# Patient Record
Sex: Male | Born: 1995 | Race: White | Hispanic: No | Marital: Single | Smoking: Never smoker
Health system: Southern US, Community
[De-identification: ages and names within clinical notes are randomized; demographics above are authoritative.]

## PROBLEM LIST (undated history)

## (undated) ENCOUNTER — Ambulatory Visit: Admission: EM | Discharge status: Absent without leave | Payer: BC Managed Care – PPO

---
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
## 2015-12-21 DIAGNOSIS — Z23 Encounter for immunization: Secondary | ICD-10-CM | POA: Diagnosis not present

## 2016-03-29 DIAGNOSIS — J069 Acute upper respiratory infection, unspecified: Secondary | ICD-10-CM | POA: Diagnosis not present

## 2016-03-29 DIAGNOSIS — J029 Acute pharyngitis, unspecified: Secondary | ICD-10-CM | POA: Diagnosis not present

## 2016-10-15 DIAGNOSIS — L239 Allergic contact dermatitis, unspecified cause: Secondary | ICD-10-CM | POA: Diagnosis not present

## 2016-10-29 DIAGNOSIS — S40212A Abrasion of left shoulder, initial encounter: Secondary | ICD-10-CM | POA: Diagnosis not present

## 2016-10-29 DIAGNOSIS — R58 Hemorrhage, not elsewhere classified: Secondary | ICD-10-CM | POA: Diagnosis not present

## 2016-10-29 DIAGNOSIS — M25522 Pain in left elbow: Secondary | ICD-10-CM | POA: Diagnosis not present

## 2016-10-29 DIAGNOSIS — T1490XA Injury, unspecified, initial encounter: Secondary | ICD-10-CM | POA: Diagnosis not present

## 2016-10-29 DIAGNOSIS — S41112A Laceration without foreign body of left upper arm, initial encounter: Secondary | ICD-10-CM | POA: Diagnosis not present

## 2016-10-29 DIAGNOSIS — M79602 Pain in left arm: Secondary | ICD-10-CM | POA: Diagnosis not present

## 2016-10-31 DIAGNOSIS — R21 Rash and other nonspecific skin eruption: Secondary | ICD-10-CM | POA: Diagnosis not present

## 2016-10-31 DIAGNOSIS — S5002XA Contusion of left elbow, initial encounter: Secondary | ICD-10-CM | POA: Diagnosis not present

## 2016-10-31 DIAGNOSIS — M542 Cervicalgia: Secondary | ICD-10-CM | POA: Diagnosis not present

## 2016-10-31 DIAGNOSIS — Z6823 Body mass index (BMI) 23.0-23.9, adult: Secondary | ICD-10-CM | POA: Diagnosis not present

## 2016-11-06 DIAGNOSIS — S51012A Laceration without foreign body of left elbow, initial encounter: Secondary | ICD-10-CM | POA: Diagnosis not present

## 2016-11-06 DIAGNOSIS — Z4802 Encounter for removal of sutures: Secondary | ICD-10-CM | POA: Diagnosis not present

## 2016-11-06 DIAGNOSIS — L42 Pityriasis rosea: Secondary | ICD-10-CM | POA: Diagnosis not present

## 2016-12-12 DIAGNOSIS — S5002XA Contusion of left elbow, initial encounter: Secondary | ICD-10-CM | POA: Diagnosis not present

## 2016-12-16 DIAGNOSIS — Z23 Encounter for immunization: Secondary | ICD-10-CM | POA: Diagnosis not present

## 2016-12-19 DIAGNOSIS — M25522 Pain in left elbow: Secondary | ICD-10-CM | POA: Diagnosis not present

## 2017-01-05 DIAGNOSIS — F17200 Nicotine dependence, unspecified, uncomplicated: Secondary | ICD-10-CM | POA: Diagnosis not present

## 2017-01-05 DIAGNOSIS — Z888 Allergy status to other drugs, medicaments and biological substances status: Secondary | ICD-10-CM | POA: Diagnosis not present

## 2017-01-05 DIAGNOSIS — S50352A Superficial foreign body of left elbow, initial encounter: Secondary | ICD-10-CM | POA: Diagnosis not present

## 2017-01-05 DIAGNOSIS — Z1881 Retained glass fragments: Secondary | ICD-10-CM | POA: Diagnosis not present

## 2017-01-05 DIAGNOSIS — M795 Residual foreign body in soft tissue: Secondary | ICD-10-CM | POA: Diagnosis not present

## 2017-01-14 DIAGNOSIS — M25562 Pain in left knee: Secondary | ICD-10-CM | POA: Diagnosis not present

## 2017-01-14 DIAGNOSIS — Z9889 Other specified postprocedural states: Secondary | ICD-10-CM | POA: Diagnosis not present

## 2017-01-14 DIAGNOSIS — M2242 Chondromalacia patellae, left knee: Secondary | ICD-10-CM | POA: Diagnosis not present

## 2017-01-14 DIAGNOSIS — M2241 Chondromalacia patellae, right knee: Secondary | ICD-10-CM | POA: Diagnosis not present

## 2017-01-14 DIAGNOSIS — M25561 Pain in right knee: Secondary | ICD-10-CM | POA: Diagnosis not present

## 2017-01-14 DIAGNOSIS — G8929 Other chronic pain: Secondary | ICD-10-CM | POA: Diagnosis not present

## 2017-01-27 DIAGNOSIS — M2242 Chondromalacia patellae, left knee: Secondary | ICD-10-CM | POA: Diagnosis not present

## 2017-01-27 DIAGNOSIS — M2241 Chondromalacia patellae, right knee: Secondary | ICD-10-CM | POA: Diagnosis not present

## 2017-02-03 DIAGNOSIS — M2241 Chondromalacia patellae, right knee: Secondary | ICD-10-CM | POA: Diagnosis not present

## 2017-02-03 DIAGNOSIS — M2242 Chondromalacia patellae, left knee: Secondary | ICD-10-CM | POA: Diagnosis not present

## 2017-02-05 DIAGNOSIS — M2242 Chondromalacia patellae, left knee: Secondary | ICD-10-CM | POA: Diagnosis not present

## 2017-02-05 DIAGNOSIS — M2241 Chondromalacia patellae, right knee: Secondary | ICD-10-CM | POA: Diagnosis not present

## 2017-02-09 DIAGNOSIS — M25561 Pain in right knee: Secondary | ICD-10-CM | POA: Diagnosis not present

## 2017-02-09 DIAGNOSIS — M25562 Pain in left knee: Secondary | ICD-10-CM | POA: Diagnosis not present

## 2017-02-09 DIAGNOSIS — M6281 Muscle weakness (generalized): Secondary | ICD-10-CM | POA: Diagnosis not present

## 2017-02-19 DIAGNOSIS — L648 Other androgenic alopecia: Secondary | ICD-10-CM | POA: Diagnosis not present

## 2017-02-19 DIAGNOSIS — L905 Scar conditions and fibrosis of skin: Secondary | ICD-10-CM | POA: Diagnosis not present

## 2017-02-19 DIAGNOSIS — D224 Melanocytic nevi of scalp and neck: Secondary | ICD-10-CM | POA: Diagnosis not present

## 2017-02-19 DIAGNOSIS — L578 Other skin changes due to chronic exposure to nonionizing radiation: Secondary | ICD-10-CM | POA: Diagnosis not present

## 2017-02-20 DIAGNOSIS — M238X1 Other internal derangements of right knee: Secondary | ICD-10-CM | POA: Diagnosis not present

## 2017-02-20 DIAGNOSIS — M238X2 Other internal derangements of left knee: Secondary | ICD-10-CM | POA: Diagnosis not present

## 2017-02-20 DIAGNOSIS — Z9889 Other specified postprocedural states: Secondary | ICD-10-CM | POA: Diagnosis not present

## 2017-03-05 DIAGNOSIS — Z6822 Body mass index (BMI) 22.0-22.9, adult: Secondary | ICD-10-CM | POA: Diagnosis not present

## 2017-03-05 DIAGNOSIS — F418 Other specified anxiety disorders: Secondary | ICD-10-CM | POA: Diagnosis not present

## 2017-03-05 DIAGNOSIS — L658 Other specified nonscarring hair loss: Secondary | ICD-10-CM | POA: Diagnosis not present

## 2017-03-06 DIAGNOSIS — L648 Other androgenic alopecia: Secondary | ICD-10-CM | POA: Diagnosis not present

## 2017-03-06 DIAGNOSIS — L905 Scar conditions and fibrosis of skin: Secondary | ICD-10-CM | POA: Diagnosis not present

## 2017-03-06 DIAGNOSIS — H5213 Myopia, bilateral: Secondary | ICD-10-CM | POA: Diagnosis not present

## 2017-08-16 DIAGNOSIS — R509 Fever, unspecified: Secondary | ICD-10-CM | POA: Diagnosis not present

## 2017-08-18 DIAGNOSIS — E876 Hypokalemia: Secondary | ICD-10-CM | POA: Diagnosis not present

## 2017-08-18 DIAGNOSIS — R141 Gas pain: Secondary | ICD-10-CM | POA: Diagnosis not present

## 2017-08-18 DIAGNOSIS — K529 Noninfective gastroenteritis and colitis, unspecified: Secondary | ICD-10-CM | POA: Diagnosis not present

## 2017-11-11 DIAGNOSIS — Z711 Person with feared health complaint in whom no diagnosis is made: Secondary | ICD-10-CM | POA: Diagnosis not present

## 2017-12-07 DIAGNOSIS — N50811 Right testicular pain: Secondary | ICD-10-CM | POA: Diagnosis not present

## 2017-12-14 DIAGNOSIS — N451 Epididymitis: Secondary | ICD-10-CM | POA: Diagnosis not present

## 2017-12-31 DIAGNOSIS — Z23 Encounter for immunization: Secondary | ICD-10-CM | POA: Diagnosis not present

## 2017-12-31 DIAGNOSIS — Z0000 Encounter for general adult medical examination without abnormal findings: Secondary | ICD-10-CM | POA: Diagnosis not present

## 2018-02-11 DIAGNOSIS — Z23 Encounter for immunization: Secondary | ICD-10-CM | POA: Diagnosis not present

## 2018-03-05 ENCOUNTER — Ambulatory Visit (INDEPENDENT_AMBULATORY_CARE_PROVIDER_SITE_OTHER): Payer: BLUE CROSS/BLUE SHIELD

## 2018-03-05 ENCOUNTER — Ambulatory Visit (INDEPENDENT_AMBULATORY_CARE_PROVIDER_SITE_OTHER): Payer: BLUE CROSS/BLUE SHIELD | Admitting: Physician Assistant

## 2018-03-05 ENCOUNTER — Encounter (INDEPENDENT_AMBULATORY_CARE_PROVIDER_SITE_OTHER): Payer: Self-pay | Admitting: Physician Assistant

## 2018-03-05 DIAGNOSIS — M25562 Pain in left knee: Secondary | ICD-10-CM

## 2018-03-05 DIAGNOSIS — M2241 Chondromalacia patellae, right knee: Secondary | ICD-10-CM | POA: Diagnosis not present

## 2018-03-05 DIAGNOSIS — M25561 Pain in right knee: Secondary | ICD-10-CM | POA: Diagnosis not present

## 2018-03-05 DIAGNOSIS — M2242 Chondromalacia patellae, left knee: Secondary | ICD-10-CM | POA: Diagnosis not present

## 2018-03-05 MED ORDER — DICLOFENAC SODIUM 1 % TD GEL: 2.0000 g | Freq: Three times a day (TID) | TRANSDERMAL | 2 refills | Status: AC | PRN
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
## 2018-03-08 ENCOUNTER — Telehealth (INDEPENDENT_AMBULATORY_CARE_PROVIDER_SITE_OTHER): Payer: Self-pay

## 2018-03-08 NOTE — Telephone Encounter (Signed)
Patients mom called and states Rx for  Voltaren Gel needs PA. Would like to know when this has been done so that they can get Rx. Thank you.   CB: [phone]

## 2018-03-11 NOTE — Telephone Encounter (Signed)
Authorization has been sent to [employer] via cover my meds. Will take 3 business day determination and will call pt once this decision has been received from [facility].

## 2018-03-12 NOTE — Telephone Encounter (Signed)
Insurance advised that no auth needed pt has picked up medication.

## 2018-04-01 DIAGNOSIS — J029 Acute pharyngitis, unspecified: Secondary | ICD-10-CM | POA: Diagnosis not present

## 2018-04-08 ENCOUNTER — Telehealth (INDEPENDENT_AMBULATORY_CARE_PROVIDER_SITE_OTHER): Payer: Self-pay | Admitting: Physician Assistant

## 2018-04-08 NOTE — Telephone Encounter (Signed)
Patients dad Donald Daniel received copy of the notes and was questioning if that was all the records. I verified the ov note is the only note and nothing was missing.

## 2018-08-04 DIAGNOSIS — Z23 Encounter for immunization: Secondary | ICD-10-CM | POA: Diagnosis not present

## 2019-02-01 ENCOUNTER — Encounter: Payer: Self-pay | Admitting: Orthopedic Surgery

## 2019-02-01 ENCOUNTER — Ambulatory Visit: Payer: BC Managed Care – PPO | Admitting: Orthopedic Surgery

## 2019-02-01 ENCOUNTER — Other Ambulatory Visit: Payer: Self-pay

## 2019-02-01 ENCOUNTER — Ambulatory Visit: Payer: Self-pay

## 2019-02-01 DIAGNOSIS — M25561 Pain in right knee: Secondary | ICD-10-CM | POA: Diagnosis not present

## 2019-02-01 DIAGNOSIS — G8929 Other chronic pain: Secondary | ICD-10-CM

## 2019-02-01 DIAGNOSIS — M25562 Pain in left knee: Secondary | ICD-10-CM | POA: Diagnosis not present
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
## 2019-02-02 ENCOUNTER — Ambulatory Visit: Payer: Self-pay | Admitting: Family

## 2019-02-17 ENCOUNTER — Telehealth: Payer: Self-pay | Admitting: Orthopedic Surgery

## 2019-02-17 NOTE — Telephone Encounter (Signed)
Received vm from pts mother,Kathy. Inquiring on pts request for records. IC,lmvm advised request was processed 12/1.

## 2019-03-07 ENCOUNTER — Other Ambulatory Visit: Payer: Self-pay

## 2019-03-07 ENCOUNTER — Ambulatory Visit
Admission: RE | Admit: 2019-03-07 | Discharge: 2019-03-07 | Disposition: A | Discharge status: Home / Self Care | Payer: BC Managed Care – PPO | Source: Ambulatory Visit | Attending: Orthopedic Surgery | Admitting: Orthopedic Surgery

## 2019-03-07 DIAGNOSIS — G8929 Other chronic pain: Secondary | ICD-10-CM

## 2019-03-07 DIAGNOSIS — M25562 Pain in left knee: Secondary | ICD-10-CM | POA: Diagnosis not present

## 2019-03-07 DIAGNOSIS — S8991XA Unspecified injury of right lower leg, initial encounter: Secondary | ICD-10-CM | POA: Diagnosis not present

## 2019-03-07 DIAGNOSIS — M25561 Pain in right knee: Secondary | ICD-10-CM | POA: Diagnosis not present

## 2019-03-10 ENCOUNTER — Encounter: Payer: Self-pay | Admitting: Orthopedic Surgery

## 2019-03-10 ENCOUNTER — Other Ambulatory Visit: Payer: Self-pay

## 2019-03-10 ENCOUNTER — Ambulatory Visit: Payer: BC Managed Care – PPO | Admitting: Orthopedic Surgery

## 2019-03-10 VITALS — Ht 72.0 in | Wt 175.0 lb

## 2019-03-10 DIAGNOSIS — M25561 Pain in right knee: Secondary | ICD-10-CM | POA: Diagnosis not present

## 2019-03-10 DIAGNOSIS — M25562 Pain in left knee: Secondary | ICD-10-CM

## 2019-03-10 DIAGNOSIS — M2241 Chondromalacia patellae, right knee: Secondary | ICD-10-CM | POA: Diagnosis not present

## 2019-03-10 DIAGNOSIS — M2242 Chondromalacia patellae, left knee: Secondary | ICD-10-CM | POA: Diagnosis not present

## 2019-03-10 DIAGNOSIS — G8929 Other chronic pain: Secondary | ICD-10-CM

## 2019-03-21 ENCOUNTER — Encounter: Payer: Self-pay | Admitting: Orthopedic Surgery

## 2019-03-21 NOTE — Progress Notes (Signed)
   Office Visit Note   Patient: Donald Daniel           Date of Birth: 1995/11/08           MRN: [id number] Visit Date: 03/10/2019              Requested by: No referring provider defined for this encounter. PCP: Donald Daniel  Chief Complaint  Patient presents with  . Right Knee - Follow-up    MRI Review  . Left Knee - Follow-up      HPI: Patient is a 24 year old gentleman who presents in follow-up for both knees status post bilateral knee MRI scans.  Patient does not smoke has a negative history for diabetes states he still has significant pain behind the kneecap with crepitation.  Assessment & Plan: Visit Diagnoses:  1. Chronic pain of both knees   2. Chondromalacia of both patellae     Plan: Discussed that he does have chondromalacia the patellofemoral joint from maltracking of the patella.  Discussed the importance of quad isometric straight leg raises as well as close chain kinetic exercises these were demonstrated.  Follow-Up Instructions: Return if symptoms worsen or fail to improve.   Ortho Exam  Patient is alert, oriented, no adenopathy, well-dressed, normal affect, normal respiratory effort. Examination patient is crepitation of patellofemoral joint with range of motion there is no effusion no redness in either knee collaterals and cruciates are stable pain is reproduced with palpation of the patellofemoral joint.  Medial and lateral joint lines are nontender to palpation.  MRI scans were reviewed for both knees which showed no internal derangement no meniscal or ligamentous tear no evidence of trauma  Imaging: No results found. No images are attached to the encounter.  Labs: No results found for: HGBA1C, ESRSEDRATE, CRP, LABURIC, REPTSTATUS, GRAMSTAIN, CULT, LABORGA   No results found for: ALBUMIN, PREALBUMIN, LABURIC  No results found for: MG No results found for: VD25OH  No results found for: PREALBUMIN produced by palpation in the patellofemoral  joint medial lateral joint lines are nontender to palpation. No flowsheet data found.   Body mass index is 23.73 kg/m.  Orders:  No orders of the defined types were placed in this encounter.  No orders of the defined types were placed in this encounter.    Procedures: No procedures performed  Clinical Data: No additional findings.  ROS:  All other systems negative, except as noted in the HPI. Review of Systems  Objective: Vital Signs: Ht 6' (1.829 m)   Wt 175 lb (79.4 kg)   BMI 23.73 kg/m   Specialty Comments:  No specialty comments available.  PMFS History: There are no problems to display for this patient.  History reviewed. No pertinent past medical history.  History reviewed. No pertinent family history.  History reviewed. No pertinent surgical history. Social History   Occupational History  . Not on file  Tobacco Use  . Smoking status: Never Smoker  . Smokeless tobacco: Never Used  Substance and Sexual Activity  . Alcohol use: Not Currently  . Drug use: Not Currently  . Sexual activity: Not Currently
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
